# Patient Record
Sex: Male | Born: 1995 | Race: White | Hispanic: No | Marital: Married | Smoking: Never smoker
Health system: Southern US, Community
[De-identification: ages and names within clinical notes are randomized; demographics above are authoritative.]

---
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
## 2019-07-18 DIAGNOSIS — R29810 Facial weakness: Secondary | ICD-10-CM

## 2019-07-18 DIAGNOSIS — G510 Bell's palsy: Secondary | ICD-10-CM

## 2019-07-18 DIAGNOSIS — I160 Hypertensive urgency: Secondary | ICD-10-CM

## 2019-07-18 DIAGNOSIS — R200 Anesthesia of skin: Secondary | ICD-10-CM

## 2019-07-24 ENCOUNTER — Telehealth: Payer: Self-pay | Admitting: Neurology

## 2019-07-24 ENCOUNTER — Encounter: Payer: Self-pay | Admitting: Neurology

## 2019-07-24 ENCOUNTER — Ambulatory Visit: Payer: Medicaid Other | Admitting: Neurology

## 2019-07-24 ENCOUNTER — Other Ambulatory Visit: Payer: Self-pay

## 2019-07-24 VITALS — BP 146/97 | HR 109 | Temp 97.5°F | Ht 73.0 in | Wt 319.0 lb

## 2019-07-24 DIAGNOSIS — R0683 Snoring: Secondary | ICD-10-CM | POA: Diagnosis not present

## 2019-07-24 DIAGNOSIS — R030 Elevated blood-pressure reading, without diagnosis of hypertension: Secondary | ICD-10-CM | POA: Diagnosis not present

## 2019-07-24 DIAGNOSIS — G510 Bell's palsy: Secondary | ICD-10-CM | POA: Diagnosis not present

## 2019-07-24 DIAGNOSIS — Z6841 Body Mass Index (BMI) 40.0 and over, adult: Secondary | ICD-10-CM

## 2019-07-24 MED ORDER — VALACYCLOVIR HCL 1 G PO TABS: 1000.0000 mg | ORAL_TABLET | Freq: Three times a day (TID) | ORAL | 0 refills | Status: AC

## 2019-07-24 NOTE — Progress Notes (Signed)
Subjective:    Patient ID: Reginald Martinez is a 24 y.o. male.  HPI     [name], MD, PhD [facility] [address], [address] [address] [place], [place] [address]  I saw patient, Reginald Martinez, as a referral from [facility] emergency room for left facial weakness.  The patient is accompanied by his mother today.  He presented to the emergency room on 07/18/2019 with left eyelid droopiness, facial numbness and tongue numbness that started that day.  He was found to have hypertensive urgency and was treated with labetalol.  He was suspected to have the beginning of Bell's palsy, an MRI was attempted but he did not fit in the scanner.  He had a CT of the head which was reportedly negative for stroke but I was not able to review the report.  He was started on prednisone 60 mg with a 1 week course.  He reports that he finished the prednisone yesterday.  Symptoms of his left facial numbness and weakness started on 07/18/2019 or maybe on 07/17/2019.  He would be willing to pursue a brain MRI.  He has not been treated with an antiviral medication.  Denies any recent illness but does have a dry cough.  He does not sleep very well.  He endorses stress.  He lives with his mom, dad, sister, sisters children and other nieces and nephews visit as they live close, on the same property.  He is a non-smoker and does not drink alcohol, does not drink caffeine on a daily basis.  He denies any weakness or numbness elsewhere.  He feels that his facial weakness has improved.  He has not had a recent eye examination.  He denies any ear pain or blurry vision or double vision.  He does snore.  Mom has noted some choking sounds.  He does have some daytime tiredness.  He has had weight gain over time.  He is on disability due to learning difficulties.  Blood work in the ER from 07/18/2019 showed BUN of 10, creatinine of 0.8, WBC of 11.6, AST 43, ALT 73, blood sugar level was elevated at 145.  His Past Medical History  Is Significant For: History reviewed. No pertinent past medical history.  His Past Surgical History Is Significant For: History reviewed. No pertinent surgical history.  His Family History Is Significant For: History reviewed. No pertinent family history.  His Social History Is Significant For: Social History   Socioeconomic History  . Marital status: Married    Spouse name: Not on file  . Number of children: Not on file  . Years of education: Not on file  . Highest education level: Not on file  Occupational History  . Not on file  Tobacco Use  . Smoking status: Never Smoker  . Smokeless tobacco: Never Used  Substance and Sexual Activity  . Alcohol use: No  . Drug use: No  . Sexual activity: Not on file  Other Topics Concern  . Not on file  Social History Narrative  . Not on file   Social Determinants of Health   Financial Resource Strain:   . Difficulty of Paying Living Expenses: Not on file  Food Insecurity:   . Worried About Programme researcher, broadcasting/film/video in the Last Year: Not on file  . Ran Out of Food in the Last Year: Not on file  Transportation Needs:   . Lack of Transportation (Medical): Not on file  . Lack of Transportation (Non-Medical): Not on  file  Physical Activity:   . Days of Exercise per Week: Not on file  . Minutes of Exercise per Session: Not on file  Stress:   . Feeling of Stress : Not on file  Social Connections:   . Frequency of Communication with Friends and Family: Not on file  . Frequency of Social Gatherings with Friends and Family: Not on file  . Attends Religious Services: Not on file  . Active Member of Clubs or Organizations: Not on file  . Attends Banker Meetings: Not on file  . Marital Status: Not on file    His Allergies Are:  No Known Allergies:   His Current Medications Are:  Outpatient Encounter Medications as of 07/24/2019  Medication Sig  . Atorvastatin Calcium (LIPITOR PO) Take by mouth.  . busPIRone (BUSPAR) 10 MG  tablet Take 10 mg by mouth 2 (two) times daily.  Marland Kitchen FLUoxetine (PROZAC) 40 MG capsule Take 40 mg by mouth daily.  Marland Kitchen LOSARTAN POTASSIUM-HCTZ PO Take by mouth.  . valACYclovir (VALTREX) 1000 MG tablet Take 1 tablet (1,000 mg total) by mouth 3 (three) times daily. For 7 days, for bell's palsy  . [DISCONTINUED] hydrOXYzine (ATARAX/VISTARIL) 25 MG tablet Take 1 tablet (25 mg total) by mouth every 6 (six) hours.  . [DISCONTINUED] permethrin (ELIMITE) 5 % cream Apply to affected area once   No facility-administered encounter medications on file as of 07/24/2019.  :  Review of Systems:  Out of a complete 14 point review of systems, all are reviewed and negative with the exception of these symptoms as listed below: Review of Systems  Neurological:       Here to discuss left sided facial weakness. Reports weakness presented its self 5 days ago. Went to ED for possible bells palsy. Pt was started on 4 day taper on prednisone sx have improved some but not completely resolved.      Objective:  Neurological Exam  Physical Exam Physical Examination:   Vitals:   07/24/19 1040  BP: (!) 146/97  Pulse: (!) 109  Temp: (!) 97.5 F (36.4 C)   General Examination: The patient is a very pleasant 24 y.o. male in no acute distress. He appears well-developed and well-nourished and well groomed.   HEENT: Normocephalic, atraumatic, pupils are equal, round and reactive to light and accommodation. Funduscopic exam is normal with sharp disc margins noted.  Extraocular tracking is good without limitation to gaze excursion or nystagmus noted. Normal smooth pursuit is noted. Hearing is grossly intact. Tympanic membranes are clear bilaterally.Face is mildly asymmetric, With mild left facial weakness noted including forehead, no significant Bell's phenomenon, eyelid closure is weaker on the left but almost complete. Normal facial animation and normal facial sensation. Speech is clear with no dysarthria noted. There is no  hypophonia. There is no lip, neck/head, jaw or voice tremor. Neck is supple with full range of passive and active motion. There are no carotid bruits on auscultation. Oropharynx exam reveals: moderate mouth dryness, adequate dental hygiene and marked airway crowding, due to Thicker soft palate, Mallampati class III, tonsils not fully visualized, tongue protrudes centrally in palate elevates symmetrically.  Neck circumference is 20 inches.  Chest: Clear to auscultation without wheezing, rhonchi or crackles noted.  Heart: S1+S2+0, regular and normal without murmurs, rubs or gallops noted.   Abdomen: Soft, non-tender and non-distended with normal bowel sounds appreciated on auscultation.  Extremities: There is no pitting edema in the distal lower extremities bilaterally. Pedal pulses are intact.  Skin: Warm and dry without trophic changes noted.  Musculoskeletal: exam reveals no obvious joint deformities, tenderness or joint swelling or erythema.   Neurologically:  Mental status: The patient is awake, alert and oriented in all 4 spheres. His immediate and remote memory, attention, language skills and fund of knowledge are appropriate. There is no evidence of aphasia, agnosia, apraxia or anomia. Speech is clear with normal prosody and enunciation. Thought process is linear. Mood is normal and affect is blunted.  Cranial nerves II - XII are as described above under HEENT exam. In addition: shoulder shrug is normal with equal shoulder height noted. Motor exam: Normal bulk, strength and tone is noted. There is no drift, tremor or rebound. Romberg is negative. Reflexes are 2+ throughout. Babinski: Toes are flexor bilaterally. Fine motor skills and coordination: intact with normal finger taps, normal hand movements, normal rapid alternating patting, normal foot taps and normal foot agility.  Cerebellar testing: No dysmetria or intention tremor on finger to nose testing. Heel to shin is unremarkable  bilaterally. There is no truncal or gait ataxia.  Sensory exam: intact to light touch in the upper and lower extremities.  Gait, station and balance: He stands easily. No veering to one side is noted. No leaning to one side is noted. Posture is age-appropriate and stance is narrow based. Gait shows normal stride length and normal pace. No problems turning are noted.   Assessment and Plan:  In summary, Reginald Martinez is a very pleasant 24 y.o.-year old male with an underlying medical history of hypertension, hyperlipidemia, anxiety and morbid obesity with a BMI of over 40, who presents for evaluation of his left facial weakness with symptoms starting on 07/18/2019 or in the afternoon of 07/17/2019.  He had a recent emergency room visit at [facility] on 07/18/2019.  He had a teleneurology consultation as well as a head CT but could not have an MRI done at the time.  His history and exam are in keeping with left-sided Bell's palsy.  He has been given a course of prednisone and finished it yesterday.  He is advised to start Valtrex for 1 week, 1 g 3 times daily.  Typically the recommendation is to get the medication started within the first 4 days of symptom onset.  Nevertheless, since we are still early in his symptoms, within the first week, I think it still makes sense to treat him with Valtrex generic.  He is agreeable.  He is furthermore advised to proceed with a brain MRI with and without contrast.  In addition, he may be at risk for underlying obstructive sleep apnea.  Given his history of snoring and some daytime somnolence reported as well as recent hypertensive urgency which was determined during his ER visit on 07/18/2019, I do believe he would benefit from sleep evaluation.  He is agreeable to pursuing a sleep study.  He would be willing to get treatment for sleep apnea in the form of a CPAP machine.  I talked to his mother and the patient at length today.  We will call him with his MRI results.  I  provided written instructions as well as a new prescription for Valtrex and plan to see him back after his sleep study.  He is advised to monitor his eye symptoms.  Should he develop redness or irritation of the left eye, he is to seek medical attention immediately.  He is encouraged to use lubricating eyedrops and make sure he is able to close his  eyes well enough and consider using an eye patch at night to make sure his left eye does not stay open and get irritation.  He is encouraged to seek a follow-up with his eye doctor as it has been over a year since his last eye exam.  I answered all the questions today and the patient and his mother were in agreement.   [name], MD, PhD

## 2019-07-24 NOTE — Telephone Encounter (Signed)
Medicaid order sent to GI. They will obtain the auth and reach out to the patient to schedule.  

## 2019-07-24 NOTE — Patient Instructions (Addendum)
I agree that you have had Bell's Palsy: weakness of your facial muscle due to irritation of your facial nerve on the left. Bell's palsy gets better with time. Use artificial tears to your affected eye during the day as needed and use a lubricant ointment in the left eye at night and keep your eye closed at night by using an eye patch. If there is eye pain and redness, you will have to see an eye doctor immediately. The most dangerous complication from Bell's palsy is corneal infection or damage. We will do a brain MRI with and without contrast. You have finished the steroid (prednisone).As discussed, studies also recommend antiviral medication which is most useful when it is started within the first 4 days of symptoms.  You are about 6 days into your symptoms. Nevertheless, it may still be helpful to use Valtrex at this time.  Please take 1 pill 2 times a day for 1 week.  2 pill for 2 days, then stop) and an antiviral medication (Valtrex 500 mg: take 1 pill twice daily for 7 days). AA As discussed, I will also order a sleep study to rule out obstructive sleep apnea as you may be at risk for it due to snoring reported and you have some daytime tiredness and you had a recent episode of hypertensive urgency.s discussed, I will also order a sleep study to rule out obstructive sleep apnea as you may be at risk for it due to snoring reported and you have some daytime tiredness and you had a recent episode of hypertensive urgency.s discussed, I will also order a sleep study to rule out obstructive sleep apnea as you may be at risk for it dAs discussed, I will also order a sleep study to rule out obstructive sleep apnea as you may be at risk for it due to snoring reported and you have some daytime tiredness and you had a recent episode of hypertensive urgency.ue to snoring reported and you have some daytime tiredness and you had a recent episode of hypertensive urgency. As discussed, I will also order a sleep study to  rule out obstructive sleep apnea as you may be at risk for it due to snoring reported and you have some daytime tiredness and you had a recent episode of hypertensive urgency.

## 2019-09-03 ENCOUNTER — Other Ambulatory Visit: Payer: Self-pay

## 2019-09-12 ENCOUNTER — Inpatient Hospital Stay: Admission: RE | Admit: 2019-09-12 | Payer: Self-pay | Source: Ambulatory Visit

## 2019-10-15 ENCOUNTER — Other Ambulatory Visit: Payer: Medicaid Other

## 2019-10-17 ENCOUNTER — Ambulatory Visit
Admission: RE | Admit: 2019-10-17 | Discharge: 2019-10-17 | Disposition: A | Discharge status: Home / Self Care | Payer: Medicaid Other | Source: Ambulatory Visit | Attending: Neurology | Admitting: Neurology

## 2019-10-17 ENCOUNTER — Other Ambulatory Visit: Payer: Self-pay

## 2019-10-17 DIAGNOSIS — Z6841 Body Mass Index (BMI) 40.0 and over, adult: Secondary | ICD-10-CM

## 2019-10-17 DIAGNOSIS — R030 Elevated blood-pressure reading, without diagnosis of hypertension: Secondary | ICD-10-CM

## 2019-10-17 DIAGNOSIS — R0683 Snoring: Secondary | ICD-10-CM

## 2019-10-17 DIAGNOSIS — G510 Bell's palsy: Secondary | ICD-10-CM

## 2019-10-17 MED ORDER — GADOBENATE DIMEGLUMINE 529 MG/ML IV SOLN
20.0000 mL | Freq: Once | INTRAVENOUS | Status: AC | PRN
  Administered 2019-10-17: 20 mL via INTRAVENOUS

## 2019-10-22 ENCOUNTER — Telehealth: Payer: Self-pay

## 2019-10-22 NOTE — Telephone Encounter (Signed)
-----   Message from Levert Feinstein, MD sent at 10/21/2019  2:28 PM EDT ----- Please call pt for normal MRI brain.

## 2019-10-22 NOTE — Telephone Encounter (Signed)
Pt's mom advised of MRI ( ok per dpr).  She verbalized understanding and had no further questions/concerns at this time.

## 2022-06-13 ENCOUNTER — Other Ambulatory Visit: Payer: Self-pay | Admitting: Family

## 2022-06-13 DIAGNOSIS — M51369 Other intervertebral disc degeneration, lumbar region without mention of lumbar back pain or lower extremity pain: Secondary | ICD-10-CM

## 2022-06-13 DIAGNOSIS — M5136 Other intervertebral disc degeneration, lumbar region: Secondary | ICD-10-CM

## 2022-07-07 ENCOUNTER — Ambulatory Visit
Admission: RE | Admit: 2022-07-07 | Discharge: 2022-07-07 | Disposition: A | Discharge status: Home / Self Care | Payer: Medicaid Other | Source: Ambulatory Visit | Attending: Family | Admitting: Family

## 2022-07-07 DIAGNOSIS — M5136 Other intervertebral disc degeneration, lumbar region: Secondary | ICD-10-CM
